# Patient Record
Sex: Female | Born: 1960 | Race: White | Hispanic: No | Smoking: Never smoker
Health system: Southern US, Community
[De-identification: ages and names within clinical notes are randomized; demographics above are authoritative.]

## PROBLEM LIST (undated history)

## (undated) DIAGNOSIS — I10 Essential (primary) hypertension: Secondary | ICD-10-CM

## (undated) DIAGNOSIS — E785 Hyperlipidemia, unspecified: Secondary | ICD-10-CM

## (undated) HISTORY — DX: Essential (primary) hypertension: I10

## (undated) HISTORY — DX: Hyperlipidemia, unspecified: E78.5

---

## 1997-10-31 ENCOUNTER — Other Ambulatory Visit: Admission: RE | Admit: 1997-10-31 | Discharge: 1997-10-31 | Payer: Self-pay | Admitting: Family Medicine

## 1999-11-27 ENCOUNTER — Encounter: Admission: RE | Admit: 1999-11-27 | Discharge: 1999-11-27 | Payer: Self-pay | Admitting: Family Medicine

## 1999-11-27 ENCOUNTER — Encounter: Payer: Self-pay | Admitting: Family Medicine

## 2008-08-15 ENCOUNTER — Other Ambulatory Visit: Admission: RE | Admit: 2008-08-15 | Discharge: 2008-08-15 | Payer: Self-pay | Admitting: Family Medicine

## 2008-12-30 ENCOUNTER — Ambulatory Visit: Payer: Self-pay | Admitting: Family Medicine

## 2009-11-14 ENCOUNTER — Other Ambulatory Visit: Admission: RE | Admit: 2009-11-14 | Discharge: 2009-11-14 | Payer: Self-pay | Admitting: Family Medicine

## 2009-11-18 ENCOUNTER — Ambulatory Visit: Payer: Self-pay

## 2010-11-19 ENCOUNTER — Ambulatory Visit: Payer: Self-pay

## 2011-02-08 ENCOUNTER — Other Ambulatory Visit: Payer: Self-pay | Admitting: Physician Assistant

## 2011-02-08 ENCOUNTER — Other Ambulatory Visit (HOSPITAL_COMMUNITY)
Admission: RE | Admit: 2011-02-08 | Discharge: 2011-02-08 | Disposition: A | Discharge status: Home / Self Care | Payer: BC Managed Care – PPO | Source: Ambulatory Visit | Attending: Family Medicine | Admitting: Family Medicine

## 2011-02-08 DIAGNOSIS — Z124 Encounter for screening for malignant neoplasm of cervix: Secondary | ICD-10-CM | POA: Insufficient documentation

## 2011-10-08 ENCOUNTER — Other Ambulatory Visit: Payer: Self-pay | Admitting: Gastroenterology

## 2011-10-27 ENCOUNTER — Other Ambulatory Visit: Payer: Self-pay | Admitting: Physician Assistant

## 2011-10-27 DIAGNOSIS — R1013 Epigastric pain: Secondary | ICD-10-CM

## 2011-11-02 ENCOUNTER — Ambulatory Visit
Admission: RE | Admit: 2011-11-02 | Discharge: 2011-11-02 | Disposition: A | Discharge status: Home / Self Care | Payer: BC Managed Care – PPO | Source: Ambulatory Visit | Attending: Physician Assistant | Admitting: Physician Assistant

## 2011-11-02 DIAGNOSIS — R1013 Epigastric pain: Secondary | ICD-10-CM

## 2011-11-24 ENCOUNTER — Ambulatory Visit: Payer: Self-pay

## 2012-04-20 ENCOUNTER — Other Ambulatory Visit (HOSPITAL_COMMUNITY)
Admission: RE | Admit: 2012-04-20 | Discharge: 2012-04-20 | Disposition: A | Discharge status: Home / Self Care | Payer: BC Managed Care – PPO | Source: Ambulatory Visit | Attending: Family Medicine | Admitting: Family Medicine

## 2012-04-20 ENCOUNTER — Other Ambulatory Visit: Payer: Self-pay | Admitting: Physician Assistant

## 2012-04-20 DIAGNOSIS — Z124 Encounter for screening for malignant neoplasm of cervix: Secondary | ICD-10-CM | POA: Insufficient documentation

## 2012-05-16 ENCOUNTER — Encounter (INDEPENDENT_AMBULATORY_CARE_PROVIDER_SITE_OTHER): Payer: Self-pay | Admitting: General Surgery

## 2012-05-16 ENCOUNTER — Ambulatory Visit (INDEPENDENT_AMBULATORY_CARE_PROVIDER_SITE_OTHER): Payer: BC Managed Care – PPO | Admitting: General Surgery

## 2012-05-16 VITALS — BP 124/80 | HR 110 | Temp 98.0°F | Resp 20 | Ht 63.5 in | Wt 186.0 lb

## 2012-05-16 DIAGNOSIS — K8020 Calculus of gallbladder without cholecystitis without obstruction: Secondary | ICD-10-CM

## 2012-05-16 NOTE — Patient Instructions (Signed)
Your recurring episodes of right upper quadrant pain and back pain are almost certainly due to your gallstones.  You will be scheduled for a laparoscopic cholecystectomy with cholangiogram, possible open in the near future.         Laparoscopic Cholecystectomy Laparoscopic cholecystectomy is surgery to remove the gallbladder. The gallbladder is located slightly to the right of center in the abdomen, behind the liver. It is a concentrating and storage sac for the bile produced in the liver. Bile aids in the digestion and absorption of fats. Gallbladder disease (cholecystitis) is an inflammation of your gallbladder. This condition is usually caused by a buildup of gallstones (cholelithiasis) in your gallbladder. Gallstones can block the flow of bile, resulting in inflammation and pain. In severe cases, emergency surgery may be required. When emergency surgery is not required, you will have time to prepare for the procedure. Laparoscopic surgery is an alternative to open surgery. Laparoscopic surgery usually has a shorter recovery time. Your common bile duct may also need to be examined and explored. Your caregiver will discuss this with you if he or she feels this should be done. If stones are found in the common bile duct, they may be removed. LET YOUR CAREGIVER KNOW ABOUT:  Allergies to food or medicine.  Medicines taken, including vitamins, herbs, eyedrops, over-the-counter medicines, and creams.  Use of steroids (by mouth or creams).  Previous problems with anesthetics or numbing medicines.  History of bleeding problems or blood clots.  Previous surgery.  Other health problems, including diabetes and kidney problems.  Possibility of pregnancy, if this applies. RISKS AND COMPLICATIONS All surgery is associated with risks. Some problems that may occur following this procedure include:  Infection.  Damage to the common bile duct, nerves, arteries, veins, or other internal organs  such as the stomach or intestines.  Bleeding.  A stone may remain in the common bile duct. BEFORE THE PROCEDURE  Do not take aspirin for 3 days prior to surgery or blood thinners for 1 week prior to surgery.  Do not eat or drink anything after midnight the night before surgery.  Let your caregiver know if you develop a cold or other infectious problem prior to surgery.  You should be present 60 minutes before the procedure or as directed. PROCEDURE  You will be given medicine that makes you sleep (general anesthetic). When you are asleep, your surgeon will make several small cuts (incisions) in your abdomen. One of these incisions is used to insert a small, lighted scope (laparoscope) into the abdomen. The laparoscope helps the surgeon see into your abdomen. Carbon dioxide gas will be pumped into your abdomen. The gas allows more room for the surgeon to perform your surgery. Other operating instruments are inserted through the other incisions. Laparoscopic procedures may not be appropriate when:  There is major scarring from previous surgery.  The gallbladder is extremely inflamed.  There are bleeding disorders or unexpected cirrhosis of the liver.  A pregnancy is near term.  Other conditions make the laparoscopic procedure impossible. If your surgeon feels it is not safe to continue with a laparoscopic procedure, he or she will perform an open abdominal procedure. In this case, the surgeon will make an incision to open the abdomen. This gives the surgeon a larger view and field to work within. This may allow the surgeon to perform procedures that sometimes cannot be performed with a laparoscope alone. Open surgery has a longer recovery time. AFTER THE PROCEDURE  You will be taken  to the recovery area where a nurse will watch and check your progress.  You may be allowed to go home the same day.  Do not resume physical activities until directed by your caregiver.  You may resume a  normal diet and activities as directed. Document Released: 01/25/2005 Document Revised: 04/19/2011 Document Reviewed: 07/10/2010 [facility] Patient Information 2013 [place], [place].

## 2012-05-16 NOTE — Progress Notes (Signed)
Patient ID: Lorraine Dunn, female   DOB: Dec 17, 1960, 52 y.o.   MRN: [id number]  Chief Complaint  Patient presents with  . New Evaluation    eval GB    HPI Lorraine Dunn is a 52 y.o. female.  She is referred by Milus Height, P.A. At [place] at [facility] for evaluation of symptomatic gallstones.  The patient gives a long history of intermittent episodes of right upper quadrant pain radiating to the back. Typically these are in the evening and will last for hours. She has had a couple of episodes of nausea and vomiting but usually she does not get nauseated. No fever or chills. No history of jaundice or pancreatitis. She had an ultrasound on 11/02/2011 which showed a 1.7 cm gallstone, normal common bile duct, no inflammation. Symptoms have been progressing. The pain is worse and  lasts longer. She is ready to have definitive surgical therapy.  No history of liver disease cardiac disease pulmonary disease or urologic problems.  Comorbidities include hypertension and hyperlipidemia. She is taking Prilosec but is taking it apparently thinking it might help her gallbladder pain. She had colonoscopy last year which was normal.  Social history reveals that she is single. Has 2 children. A son lives in [place]. Denies tobacco. Alcohol rare. 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  HPI  Past Medical History  Diagnosis Date  . Hypertension   . Hyperlipidemia     Past Surgical History  Procedure Laterality Date  . Cesarean section  01/04/1987    History reviewed. No pertinent family history.  Social History History  Substance Use Topics  . Smoking status: Never Smoker   . Smokeless tobacco: Never Used  . Alcohol Use: No    Allergies  Allergen Reactions  . Dilaudid (Hydromorphone Hcl) Shortness Of Breath    Current Outpatient Prescriptions  Medication Sig Dispense Refill  . atorvastatin (LIPITOR) 20 MG tablet Take 20 mg by mouth daily.      Marland Kitchen lisinopril-hydrochlorothiazide  (PRINZIDE,ZESTORETIC) 20-25 MG per tablet Take 1 tablet by mouth daily.      Marland Kitchen loratadine (CLARITIN) 10 MG tablet Take 10 mg by mouth daily.      Marland Kitchen omeprazole (PRILOSEC) 20 MG capsule Take 20 mg by mouth daily.       No current facility-administered medications for this visit.    Review of Systems Review of Systems  Constitutional: Negative for fever, chills and unexpected weight change.  HENT: Negative for hearing loss, congestion, sore throat, trouble swallowing and voice change.   Eyes: Negative for visual disturbance.  Respiratory: Negative for cough and wheezing.   Cardiovascular: Negative for chest pain, palpitations and leg swelling.  Gastrointestinal: Positive for nausea, vomiting and abdominal pain. Negative for diarrhea, constipation, blood in stool, abdominal distention and anal bleeding.  Genitourinary: Negative for hematuria, vaginal bleeding and difficulty urinating.  Musculoskeletal: Negative for arthralgias.  Skin: Negative for rash and wound.  Neurological: Negative for seizures, syncope and headaches.  Hematological: Negative for adenopathy. Does not bruise/bleed easily.  Psychiatric/Behavioral: Negative for confusion.    Blood pressure 124/80, pulse 110, temperature 98 F (36.7 C), temperature source Temporal, resp. rate 20, height 5' 3.5" (1.613 m), weight 186 lb (84.369 kg).  Physical Exam Physical Exam  Constitutional: She is oriented to person, place, and time. She appears well-developed and well-nourished. No distress.  BMI 32.4  HENT:  Head: Normocephalic and atraumatic.  Nose: Nose normal.  Mouth/Throat: No oropharyngeal exudate.  Eyes: Conjunctivae and EOM are  normal. Pupils are equal, round, and reactive to light. Left eye exhibits no discharge. No scleral icterus.  Neck: Neck supple. No JVD present. No tracheal deviation present. No thyromegaly present.  Cardiovascular: Normal rate, regular rhythm, normal heart sounds and intact distal pulses.   No  murmur heard. Pulmonary/Chest: Effort normal and breath sounds normal. No respiratory distress. She has no wheezes. She has no rales. She exhibits no tenderness.  Abdominal: Soft. Bowel sounds are normal. She exhibits no distension and no mass. There is no tenderness. There is no rebound and no guarding.  Musculoskeletal: She exhibits no edema and no tenderness.  Lymphadenopathy:    She has no cervical adenopathy.  Neurological: She is alert and oriented to person, place, and time. She exhibits normal muscle tone. Coordination normal.  Skin: Skin is warm. No rash noted. She is not diaphoretic. No erythema. No pallor.  Psychiatric: She has a normal mood and affect. Her behavior is normal. Judgment and thought content normal.    Data Reviewed Ultrasound report. Office notes from [facility] at  [place].  Assessment    Chronic cholecystitis with cholelithiasis, now with accelerating biliary colic  Hypertension  Borderline obesity  Hyperlipidemia  History of cesarean section     Plan    Scheduled for laparoscopic cholecystectomy with cholangiogram, possible open.  We had a long conversation about this. We have reviewed patient information booklet and diagrams. I discussed the indications, details, techniques, and numerous risks of the surgery with her. She understands all these issues and all of her questions are answered. She agrees with this plan.        Angelia Mould. Derrell Lolling, M.D., [facility] General and Minimally invasive Surgery Breast and Colorectal Surgery Office:   [phone] Pager:   [phone]  05/16/2012, 2:59 PM

## 2012-11-16 ENCOUNTER — Ambulatory Visit: Payer: Self-pay | Admitting: Family Medicine

## 2013-04-26 ENCOUNTER — Other Ambulatory Visit (HOSPITAL_COMMUNITY)
Admission: RE | Admit: 2013-04-26 | Discharge: 2013-04-26 | Disposition: A | Discharge status: Home / Self Care | Payer: BC Managed Care – PPO | Source: Ambulatory Visit | Attending: Family Medicine | Admitting: Family Medicine

## 2013-04-26 ENCOUNTER — Other Ambulatory Visit: Payer: Self-pay | Admitting: Physician Assistant

## 2013-04-26 DIAGNOSIS — Z124 Encounter for screening for malignant neoplasm of cervix: Secondary | ICD-10-CM | POA: Insufficient documentation

## 2013-11-27 ENCOUNTER — Ambulatory Visit: Payer: Self-pay | Admitting: Family Medicine

## 2014-04-11 ENCOUNTER — Inpatient Hospital Stay: Payer: Self-pay | Admitting: Internal Medicine

## 2014-06-03 LAB — SURGICAL PATHOLOGY

## 2014-06-09 NOTE — Discharge Summary (Signed)
PATIENT NAME:  Lorraine Dunn, Lorraine Dunn MR#:  [id number] DATE OF BIRTH:  [birth year]  DATE OF ADMISSION:  04/11/2014 DATE OF DISCHARGE:  04/17/2014  PRESENTING COMPLAINT: Abdominal pain, nausea.   DISCHARGE DIAGNOSES:  1. Gallstone pancreatitis.  2. Hypertension.  3. Hypophosphatemia.  4. Hypokalemia.   PROCEDURES: Laparoscopic cholecystectomy on 04/14/2014.   CODE STATUS: Full code.   MEDICATIONS:  1. Atorvastatin 20 mg daily.  2. Lisinopril 20 mg daily.  3. Multivitamin daily.  4. Vitamin B12 daily. 5. Fish oil 1 capsule daily.  6. Vitamin D3 1 capsule daily.  7. Tylenol 500 mg 2 tablets every 6 hours as needed.  8. Zofran 4 mg ODT 1 tablet 3 times a day as needed.  9. Potassium phosphate and sodium phosphate packet for 1 more day.   DIET: Mechanical soft diet.   FOLLOWUP: With [facility] in 1 to 2 weeks. Follow up with your primary care physician in 1 to 2 weeks.   SURGICAL CONSULTATION: Dr. Excell Seltzerooper and Dr. Juliann PulseLundquist.   BRIEF SUMMARY OF HOSPITAL COURSE: Ms. Otis PeakDonna Aure is a 54 year old obese Caucasian female, who comes in with:  1. Abdominal pain, which is likely due to pancreatitis from gallstone-induced. Ultrasound showed multiple stones present, duct not dilated. The patient was kept n.p.o. Her hydrochlorothiazide was held. P.r.n. pain medications were given. IV fluids were given. A surgical consultation was obtained. The patient underwent a cholecystectomy and drain was removed on the day of discharge. She was able to tolerate full liquid diet at discharge.  2. Fatty liver on ultrasound.  3. Hypertension. Continue lisinopril.  4. Hyperlipidemia. Continue statins.  5. Hypophosphatemia, hypokalemia. Repleted with IV and p.o.   Hospital stay otherwise remained stable.   CODE STATUS: The patient remained a full code.  ____________________________ Wylie HailSona A. Allena KatzPatel, MD sap:ap D: 04/19/2014 14:07:21 ET T: 04/19/2014 21:16:32 ET JOB#: [id number]  cc: Micala Saltsman A. Allena KatzPatel, MD,  <Dictator> unknown cc Willow OraSONA A Evola Hollis MD ELECTRONICALLY SIGNED 04/20/2014 14:30

## 2014-06-09 NOTE — H&P (Signed)
PATIENT NAME:  Lorraine BarefootCOMER, Lorraine J MR#:  [id number] DATE OF BIRTH:  Jul 05, 1960  DATE OF ADMISSION:  04/11/2014  TIME OF ADMISSION: 9:43 p.m.  CHIEF COMPLAINT: Abdominal pain.   HISTORY OF PRESENT ILLNESS: This is a 54 year old female who came to the ED for significant abdominal pain that started about 10:30 this morning, which was about 30 minutes after she took some of her normal vitamins on an empty stomach. She states that afterward she began having upper abdominal pain across both upper quadrants, but the right upper quadrant was more so than the left, and that the pain continued to progress, was associated with nausea and vomiting a total of 6 times today without hematemesis, that the pain did have some radiation in through to her back. The patient denies any diarrhea associated with these symptoms. Denies any other issues on review of systems; see full review of systems below. The patient has known gallstones from an ultrasound that was done several years ago when she had a similar pain to this, but she never had the elective surgery to have the gallbladder removed, and as far as she knows she never passed a stone and has never had pancreatitis before. On workup in the ED, the patient was found to have elevated transaminases and a significantly elevated lipase and had a right upper quadrant ultrasound ordered. At this point, the hospitalists were called for admission for pancreatitis.  PRIMARY CARE PHYSICIAN: Nonlocal, Dr. Blair Heysobert Ehinger, as well as that doctor's PA, Noelle Redmon. These providers are at [facility] in [place]  PAST MEDICAL HISTORY: Hypertension, hyperlipidemia, and gallstones.  CURRENT MEDICATIONS: Vitamin D3, vitamin B12, lisinopril and hydrochlorothiazide combination 20/25 mg daily, atorvastatin 20 mg daily, fish oil, multivitamin, and Tylenol as needed for pain.  PAST SURGICAL HISTORY: Cesarean section and tonsillectomy.  ALLERGIES:  DILAUDID.  FAMILY HISTORY: Coronary artery disease, diabetes mellitus.   SOCIAL HISTORY: The patient is a never smoker, an occasional social drinker, and denies any other drug use.  REVIEW OF SYSTEMS: CONSTITUTIONAL: Denies fever, fatigue, and weakness. EYES: Denies blurred or double vision, pain or redness. EARS, NOSE, AND THROAT: Denies ear pain, hearing loss, or difficulty swallowing.  RESPIRATORY: Denies cough, wheeze, hemoptysis, or dyspnea. CARDIOVASCULAR: Denies chest pain, dyspnea on exertion, palpitations. GASTROINTESTINAL: Endorses abdominal pain. Endorses nausea and vomiting. Denies diarrhea. Denies hematemesis and melena. Denies constipation or change in bowel habits. GENITOURINARY: Denies dysuria, hematuria, or frequency.  ENDOCRINE: Denies nocturia, thyroid problems, heat or cold intolerance. HEMATOLOGIC AND LYMPHATIC: Denies easy bruising, bleeding, or swollen glands. INTEGUMENTARY: Denies acne, rash, or lesions.  MUSCULOSKELETAL: Denies joint pains, joint swelling, and gout. NEUROLOGICAL: Denies numbness, weakness, headaches.  PSYCHIATRIC: Denies anxiety, insomnia, and depression.   PHYSICAL EXAMINATION: VITAL SIGNS: Blood pressure 150/66, pulse 53, temperature 97.4, respirations 18, with 97% oxygen saturations on room air. GENERAL: This patient is a well-nourished female lying supine in bed in no acute distress. HEAD, EYES, EARS, NOSE, AND THROAT: Pupils equal, round, reactive to light. Extraocular movements intact. No scleral icterus. Moist mucosal membranes. Good dentition. NECK: Thyroid is not enlarged. Neck is supple with no masses and is nontender. No cervical adenopathy, no JVD noted. RESPIRATORY: Clear to auscultation bilaterally with no rales, rhonchi, or wheezes. Good breath sounds equally throughout all lung fields. CARDIOVASCULAR: Regular rate and rhythm. No murmur, rubs, or gallops auscultated. Good pedal pulses. No lower extremity edema. ABDOMEN: Soft,  nondistended, with good bowel sounds. Tender in the epigastric region and right upper quadrant  with, also, some mild tenderness in the left upper quadrant. MUSCULOSKELETAL: 5/5 muscular strength throughout all 4 extremities with full range of motion spontaneously throughout all extremities. No distal cyanosis or clubbing. SKIN: Warm, dry, and intact with no rashes or lesions noted. LYMPHADENOPATHY: No adenopathy noted.  NEUROLOGIC: Cranial nerves intact. Sensation intact throughout with no dysarthria or aphasia.  PSYCHIATRIC: Patient is alert and oriented x 4, cooperative, with good insight and judgment.  LABORATORIES: White count 12.8, hemoglobin 14, hematocrit 43, platelets 276,000. Sodium 139, potassium 3.4, chloride 100, bicarbonate 30, BUN 16, creatinine 1.13. Total protein 7.6, albumin 4.0, alkaline phosphatase 102, AST elevated at 114, ALT elevated at 191, lipase elevated at 5111. Glucose 209. Troponin less than 0.02. UA was negative.  RADIOLOGY: Ultrasound of the right upper quadrant showed common bile duct 3.4 mm, which is within normal limits. Probable fatty infiltration of the liver. Cholelithiasis noted, without evidence of cholecystitis.  ASSESSMENT AND PLAN:  1.  Pancreatitis, unclear etiology at this time. The patient does have a known history of gallstones, though according to her ultrasound it does not look as though she has passed a gallstone, given the normal common bile duct size. Still, she has some elevated transaminases. We will hold all Tylenol and her statin medication for now, keep her n.p.o., give her fluids and pain control, and, depending on how she does, possibly consider a gastroenterology consult in the morning.  2.  Cholelithiasis. The patient has a known history of cholelithiasis. Right upper quadrant ultrasound showed no evidence of cholecystitis. Again, consider a gastroenterology or maybe a surgical consult, depending on how she progresses with her treatment for  pancreatitis. 3.  Hypertension. This is stable. Right now, we will continue patient's home medications for this. 4.  Elevated glucose. The patient has no history of a diagnosis of diabetes mellitus, although her glucose is pretty elevated. She is in acute state of stress in her body at the same time. We will get an A1c just for diabetes screening.  CODE STATUS: This patient is full code.  PROPHYLAXIS: Deep vein thrombosis prophylaxis is just with mechanical SCDs.  TIME SPENT ON THIS ADMISSION: 45 minutes.   ____________________________ Candace Cruise. Anne Hahn, MD dfw:ST D: 04/11/2014 22:44:33 ET T: 04/11/2014 23:07:26 ET JOB#: [id number]  cc: Candace Cruise. Anne Hahn, MD, <Dictator> Doyle Kunath Scotty Court MD ELECTRONICALLY SIGNED 04/12/2014 2:06

## 2014-06-09 NOTE — Consult Note (Signed)
Chief Complaint:  Subjective/Chief Complaint patient seen for pancreatitis.  Please see full Gi consult. Pati3ent admitted with nv and abdominalpain.  Finding on imaging of cholelithiasis, elevated lipase, mild elevations of transaminases, all improving.  Likely biliary pancreatitis.  Currently doesnt feel bloated and pain is improving.   Will increase ivf some to 150 cc/hour, serial labs (daily), Surgery consult already requested and done By Dr Burt Knack.  Agree with [facility] when clinically feasible, once lipase normalized.   Following.   VITAL SIGNS/ANCILLARY NOTES: **Vital Signs.:   04-Mar-16 16:14  Vital Signs Type Q 8hr  Temperature Temperature (F) 99.2  Temperature Source oral  Pulse Pulse 94  Respirations Respirations 18  Systolic BP Systolic BP 063  Diastolic BP (mmHg) Diastolic BP (mmHg) 69  Mean BP 83  Pulse Ox % Pulse Ox % 83  Pulse Ox Activity Level  At rest; Nurse  Notified  Oxygen Delivery Room Air/ 21 %   Brief Assessment:  Cardiac Regular   Respiratory clear BS   Gastrointestinal details normal Soft  No rebound tenderness  positive bowel sounds, mild distension, mild generalized tenderness, though mostly epigastric and ruq.   Lab Results: Hepatic:  04-Mar-16 15:18   Bilirubin, Total 0.7  Alkaline Phosphatase 79  SGPT (ALT)  125  SGOT (AST)  52  Total Protein, Serum 7.0  Albumin, Serum 3.5  Routine Chem:  03-Mar-16 17:32   Lipase  5111 (Result(s) reported on 11 Apr 2014 at 06:07PM.)  04-Mar-16 15:18   Cholesterol, Serum 150  Triglycerides, Serum 74  HDL (INHOUSE)  77  LDL Cholesterol Calculated 58 (Result(s) reported on 12 Apr 2014 at 05:57PM.)  Glucose, Serum  146  BUN 16  Creatinine (comp) 0.81  Sodium, Serum 142  Potassium, Serum 3.6  Chloride, Serum 105  CO2, Serum  33  Calcium (Total), Serum  8.3  Osmolality (calc) 287  eGFR (African American) >60  eGFR (Non-African American) >60 (eGFR values <17mL/min/1.73 m2 may be an indication of chronic kidney  disease (CKD). Calculated eGFR, using the MRDR Study equation, is useful in  patients with stable renal function. The eGFR calculation will not be reliable in acutely ill patients when serum creatinine is changing rapidly. It is not useful in patients on dialysis. The eGFR calculation may not be applicable to patients at the low and high extremes of body sizes, pregnant women, and vegetarians.)  Anion Gap  4  Lipase  2460 (Result(s) reported on 12 Apr 2014 at 04:13PM.)  Routine Hem:  03-Mar-16 17:32   WBC (CBC)  12.8   Assessment/Plan:  Assessment/Plan:  Assessment 1) biliary pancreatitis-improving.  agre with ccy when clinically feasible, following, continue current.   Plan as above.   Electronic Signatures: Loistine Simas (MD)  (Signed 04-Mar-16 20:21)  Authored: Chief Complaint, VITAL SIGNS/ANCILLARY NOTES, Brief Assessment, Lab Results, Assessment/Plan   Last Updated: 04-Mar-16 20:21 by Loistine Simas (MD)

## 2014-06-09 NOTE — Op Note (Signed)
PATIENT NAME:  Lorraine BarefootCOMER, Afiya J MR#:  [id number] DATE OF BIRTH:  08-10-1960  DATE OF PROCEDURE:  04/14/2014  PREOPERATIVE DIAGNOSIS: Biliary pancreatitis.   POSTOPERATIVE DIAGNOSIS: Biliary pancreatitis.  PROCEDURE: Laparoscopic cholecystectomy with C-arm fluoroscopic cholangiography.   SURGEON: Tykia Mellone E. Excell Seltzerooper, MD.  ANESTHESIA: General with endotracheal tube.   INDICATIONS: This is a patient with documented biliary pancreatitis. Her liver function tests and lipase have returned to normal. Therefore, she is scheduled for semi-elective laparoscopic cholecystectomy with cholangiography to prevent return of her symptoms.  Preoperatively, we discussed the rationale for surgery, the options of observation, the risks of bleeding, infection, recurrence of symptoms, failure to resolve her symptoms, open procedure, bile duct damage, bile duct leak, retained common bile duct stone, any of which could require further surgery and/or ERCP, stent, and papillotomy. This was all reviewed for her on the morning of surgery as well. She understood and agreed to proceed.   FINDINGS: Cloudy fluid in the area of the gallbladder, edematous gallbladder wall, tiny cystic duct. C-arm fluoroscopic cholangiography demonstrated good flow in the duodenum. No intraluminal filling defects. Proximal ducts were well identified and the cystic duct had been cannulated.   DESCRIPTION OF PROCEDURE: The patient was induced to general anesthesia. She was given IV antibiotics. VTE prophylaxis was in place. She was prepped and draped in a sterile fashion. Marcaine was infiltrated in skin and subcutaneous tissues around the periumbilical area. Incision was made. Veress needle was placed. Pneumoperitoneum was obtained. A 5-mm trocar port was placed. The abdominal cavity was explored and, under direct vision, a 10-mm epigastric port and 2 lateral 5-mm ports were placed. Cloudy fluid was aspirated. Scope was changed to a 30-degree 5-mm to  allow visualization of the infundibulum. Here, the peritoneum over the infundibulum was incised bluntly. The cystic duct/gallbladder junction was well identified. The cystic lymphatics were doubly clipped and divided. The tiny cystic duct was identified. It was not much bigger than the cholangiogram catheter but it was clipped and incised, and the cholangiogram catheter was then placed. C-arm fluoroscopic cholangiography demonstrated the above. The cholangiogram catheter was removed. The cystic duct was then doubly clipped and divided and the gallbladder was taken from the gallbladder fossa with electrocautery, passed out through the epigastric port site. There were vessels along the lateral and medial side of the gallbladder, which required clipping and the cystic artery had been doubly clipped and divided as well.  The area was irrigated with copious amounts of normal saline. Hemostasis was with the use of clips or electrocautery. Then into the foramen of Winslow was placed a 10-mm JP drain which was held in with 3-0 nylon. Again, hemostasis was found to be adequate. The camera was placed in the epigastric site to view back to the periumbilical site. There was no sign of adhesions or bowel injury. Therefore, pneumoperitoneum was released. All ports were removed. Fascial edges at the epigastric site were approximated with figure-of-eight 0 Vicryls; 4-0 subcuticular Monocryl was used on all skin edges. Steri-Strips, Mastisol, and sterile dressings were placed. The drain was placed to bulb suction.  The patient tolerated the procedure well. There were no complications. She was taken to the recovery room in stable condition to be admitted for continued care.    ____________________________ Adah Salvageichard E. Excell Seltzerooper, MD rec:jh D: 04/14/2014 09:15:45 ET T: 04/14/2014 12:58:03 ET JOB#: [id number]  cc: Adah Salvageichard E. Excell Seltzerooper, MD, <Dictator> Lattie HawICHARD E Debbi Strandberg MD ELECTRONICALLY SIGNED 04/14/2014 16:57

## 2014-06-09 NOTE — Consult Note (Signed)
PATIENT NAME:  [name], Dania J MR#:  [id number] DATE OF BIRTH:  1961-01-12  DATE OF CONSULTATION:  04/12/2014  REFERRING PHYSICIAN:   CONSULTING PHYSICIAN:  Adah Salvageichard E. Excell Seltzerooper, MD  CHIEF COMPLAINT: Abdominal pain.   HISTORY OF PRESENT ILLNESS: This is a patient who has biliary pancreatitis. She believes it started after taking multiple vitamins on an empty stomach yesterday morning.  She has never had an episode like this before, but states that she has had "gallbladder attacks" in the past. She has seen a surgeon in the past for gallbladder disease but never had her gallbladder removed. She has known about her gallstones for many years.  She denies fevers or chills,  no melena or hematochezia, and no jaundice or acholic stools. Her pain is in the epigastrium and radiates through to her back. It is much improved since admission.   PAST MEDICAL HISTORY: Hypertension and hyperlipidemia.   PAST SURGICAL HISTORY: C-section.   ALLERGIES: DILAUDID.   MEDICATIONS: Multiple, see reconciliation.   FAMILY HISTORY: History of gallbladder disease.  REVIEW OF SYSTEMS: A complete system review was performed and negative with the exception of that mentioned in the HPI.   SOCIAL HISTORY: The patient works as a Merchandiser, retailsupervisor at [employer]. Does not smoke or drink.   PHYSICAL EXAMINATION: GENERAL: Healthy-appearing female patient. BMI 32.  VITAL SIGNS: Stable. She is afebrile.  HEENT: No scleral icterus.  NECK: No palpable neck nodes.  CHEST: Clear to auscultation.  CARDIAC: Regular rate and rhythm.  ABDOMEN: Soft, nondistended, minimally tender in the epigastrium and right upper quadrant.  EXTREMITIES: Show minimal edema.  NEUROLOGIC: Grossly intact.  INTEGUMENT: No jaundice.   LABORATORY VALUES: Reviewed. Lipase is much less than it was prior. LFTs remain elevated. An ultrasound shows gallstones without cholecystitis.   ASSESSMENT AND PLAN: This is a patient with biliary pancreatitis. I spoke  to Dr. Marva PandaSkulskie concerning this patient's care. My recommendations would be continue observation. She can have clear liquids at this point and once she has her lipase returned to normal, then we could proceed with laparoscopic cholecystectomy with cholangiography. This was discussed with her. She understood and agreed to proceed.   ____________________________ Adah Salvageichard E. Excell Seltzerooper, MD rec:dw D: 04/12/2014 17:45:27 ET T: 04/12/2014 20:20:26 ET JOB#: [id number]  cc: Adah Salvageichard E. Excell Seltzerooper, MD, <Dictator> Lattie HawICHARD E Jamille Fisher MD ELECTRONICALLY SIGNED 04/13/2014 10:41

## 2014-06-09 NOTE — Consult Note (Signed)
Brief Consult Note: Diagnosis: biliary pancreatitis.   Patient was seen by consultant.   Consult note dictated.   Recommend to proceed with surgery or procedure.   Orders entered.   Discussed with Attending MD.   Comments: plan lap chole with grams once lipase returns to nml. Will follow.  Electronic Signatures: Lattie Hawooper, Willies Laviolette E (MD)  (Signed 04-Mar-16 17:40)  Authored: Brief Consult Note   Last Updated: 04-Mar-16 17:40 by Lattie Hawooper, Chaston Bradburn E (MD)

## 2014-06-09 NOTE — Discharge Summary (Signed)
PATIENT NAME:  [name], Lorraine Dunn DATE OF BIRTH:  10/23/1960  DATE OF ADMISSION:  04/11/2014 DATE OF DISCHARGE:  04/17/2014  DIAGNOSES:  Biliary pancreatitis, choledocholithiasis, hypertension, hyperlipidemia, and gallstones.   PROCEDURES:  Laparoscopic cholecystectomy with fluoroscopic cholangiography.   HISTORY OF PRESENT ILLNESS AND HOSPITAL COURSE:  This is a patient who was admitted to the hospital with a diagnosis of biliary pancreatitis. She was taken to the operating room, where laparoscopic cholecystectomy was performed. Cholangiography was performed as well. She made an uncomplicated postoperative recovery, although it was slightly prolonged due to ileus. The drain was removed. She was discharged in stable condition on a regular diet, to follow up in our office on oral analgesics, and she will follow up in 10 days.    ____________________________ Adah Salvageichard E. Excell Seltzerooper, MD rec:nb D: 04/22/2014 21:08:21 ET T: 04/22/2014 23:40:46 ET JOB#: [id number]  cc: Adah Salvageichard E. Excell Seltzerooper, MD, <Dictator> Lattie HawICHARD E Nikoletta Varma MD ELECTRONICALLY SIGNED 04/23/2014 22:52

## 2014-06-09 NOTE — Consult Note (Signed)
PATIENT NAME:  Lorraine Dunn, Lorraine Dunn MR#:  [id number] DATE OF BIRTH:  04-30-1960  DATE OF CONSULTATION:  04/12/2014  REFERRING PHYSICIAN:  Santina Evans P. Clent Ridges, MD  CONSULTING PHYSICIAN:  Christena Deem, MD/Millianna Szymborski A. Arvilla Market, ANP (Adult Nurse Practitioner)  REASON FOR CONSULTATION: Pancreatitis.   HISTORY OF PRESENT ILLNESS: This 54 year old Caucasian female patient with history of hypertension, hyperlipidemia, and known gallstones, presented to the Emergency Room with acute episode of abdominal pain, back pain, and vomiting. The patient states that she started to have gallbladder issues about 2 years ago. She has had intermittent attacks of mild right upper quadrant pain that radiates into the back. This can go along for a 24-hour period and then ease off. She usually does not have nausea or vomiting and finds these attacks of pain mild. They occur every few months. She has discussed this with her physician and since they not severe, a cholecystectomy has not been seriously considered.   However, yesterday, she developed an acute episode of the worst pain she has ever had, similar to her gallbladder attacks. This pain, however, started in the mid abdomen, was generalized throughout the entire abdomen, and radiated into the lower back. This pain was clearly a 10/10. She had associated vomiting to the point of dry heaves. She denies coffee-ground emesis, hematemesis, or diarrhea or melena. She thought this was a severe attack of her gallbladder pain and presented to the Emergency Room.   She was found to have elevated transaminases, and a right upper quadrant abdominal ultrasound was performed and it showed multiple gallstones with the largest measuring 1.9 cm in the fundus. There was no gallbladder wall thickening or pericolic cystic fluid or Murphy sign present. The common bile duct was normal at 3.4 mm. The liver showed mild increased echogenicity, suggesting fatty infiltration. The lipase was elevated at  5111. She was admitted with the diagnosis of pancreatitis, cholelithiasis, and has received IV fluids, narcotic pain medication, anti-emetic medication and her stomach is feeling some better. Her abdominal pain is 6-7/10; however, she still has lumbar back pain 10/10.   The patient denies history of heartburn or indigestion. She denies NSAID use because of hypertension, she only takes Tylenol. She does not present on a proton pump inhibitor. She reports no history of ulcer or previous EGD. She has had colonoscopy about 3 years ago in [place] and reports it was normal. She has noted no bowel problems, no bowel habit change.   PAST MEDICAL HISTORY: 1. Hypertension.  2. Hyperlipidemia.  3. Gallstones.   PAST SURGICAL HISTORY: 1. C-section.  2. Tonsillectomy.    HOME MEDICATIONS: 1. Vitamin D3.  2. Vitamin B12.  3. Lisinopril.  4. Hydrochlorothiazide 20/25 mg daily.  5. Atorvastatin 20 mg daily.  6. Fish oil.  7. Multivitamin.  8. Tylenol as needed for pain.   ALLERGIES: Dilaudid.   FAMILY HISTORY: Negative for colon cancer, colon polyps. Positive for CAD and disease diabetes mellitus.   SOCIAL HABITS: The patient has never smoked, occasional rare social alcohol use, no illicit drug use.   REVIEW OF SYSTEMS: Twelve systems reviewed, positive as noted in the history of present illness. She denies fevers or chills, chest pain, shortness of breath, and the remaining systems are negative.   PHYSICAL EXAMINATION: VITAL SIGNS: 98.7, 73, 18, 111/70, pulse oximetry on room air is 90%.  GENERAL: The patient is well-appearing, resting in bed, NAD.  HEENT: Head is normocephalic. Conjunctivae pink. Sclerae anicteric. Oral mucosa is moist and intact.  NECK:  Supple. Trachea is midline.  CARDIAC: S1, S2 without murmur or gallop.  LUNGS: Fully CTA. Respirations are nonlabored.    ABDOMEN: Soft, nondistended. Positive diffuse tenderness in then generalized abdomen. This is mild.  RECTAL:  Deferred.  SKIN: Warm and dry without rash or edema.  MUSCULOSKELETAL: Strength is equal bilateral 5/5 SKIN: Warm and dry. No adenopathy noted. No peripheral edema noted.  NEUROLOGIC: Cranial nerves II through XII grossly intact. Speech is clear, good historian.  PSYCHIATRIC: Affect and mood within normal, visiting with her son, NAD.   LABORATORY DATA: Admission blood work with glucose 209, hemoglobin A1c 7.1. The patient denies history of diabetes. BUN is 16, creatinine 1.13, albumin 4.0, AST 114, ALT 191. Troponin less than 0.02. WBC 12.8. Hemoglobin is 14.0.   Repeat laboratory studies this morning with glucose 146, BUN 16, creatinine 0.81, lipase down 2460, AST 52, ALT 125.   RADIOLOGY: Ultrasound as noted with cholelithiasis, fatty liver.   IMPRESSION: The patient admitted with known gallstones, elevated liver enzymes, and pancreatitis. Etiology for her pancreatitis is likely gallstone-related. We have added on triglyceride, which returned 74.   PLAN:  1. Recommend surgical consult, not urgent. Lipase will need to improve before surgery.  2. She appears to have diabetes, and this may be undiagnosed at this point.  3. Dr. Marva PandaSkulskie was consulted regarding this case. He does recommend increasing her IV fluids to 150 mL per hour until tomorrow. She will need to have close monitoring regarding pulmonary status. Consider chest x-ray.  4. Further GI recommendations pending clinical course.   Thank you for the consultation.   These services provided by Amedeo KinsmanKimberly Ricke Kimoto under collaborative agreement with Juventino SlovakMartin U. Skulski, MD.    ____________________________ Ranae PlumberKimberly A. Arvilla MarketMills, ANP (Adult Nurse Practitioner) kam:mw D: 04/12/2014 18:23:34 ET T: 04/12/2014 19:15:41 ET JOB#: [id number]  cc: Cala BradfordKimberly A. Arvilla MarketMills, ANP (Adult Nurse Practitioner), <Dictator> Ranae PlumberKimberly A. Suzette BattiestMills RN, MSN, ANP-BC Adult Nurse Practitioner ELECTRONICALLY SIGNED 04/13/2014 8:46

## 2014-06-09 NOTE — Consult Note (Signed)
Chief Complaint:  Subjective/Chief Complaint seen for pancreatitis.  felling some better today, continues with some abdominal distension though pain improved, mild nausea this am.   VITAL SIGNS/ANCILLARY NOTES: **Vital Signs.:   05-Mar-16 07:50  Vital Signs Type Q 8hr  Temperature Temperature (F) 98.6  Temperature Source oral  Pulse Pulse 85  Respirations Respirations 20  Systolic BP Systolic BP 633  Diastolic BP (mmHg) Diastolic BP (mmHg) 71  Mean BP 86  Pulse Ox % Pulse Ox % 97  Pulse Ox Activity Level  At rest  Oxygen Delivery 2L   Brief Assessment:  Cardiac Regular   Respiratory clear BS   Gastrointestinal details normal No rebound tenderness  mild distension, generalized tenderness to palpation, mostly epigastric.   Lab Results: Hepatic:  03-Mar-16 17:32   Bilirubin, Total 0.6  Alkaline Phosphatase 102  SGPT (ALT)  191  SGOT (AST)  114  04-Mar-16 15:18   Bilirubin, Total 0.7  Alkaline Phosphatase 79  SGPT (ALT)  125  SGOT (AST)  52  05-Mar-16 05:59   Bilirubin, Total 0.8  Alkaline Phosphatase 61  SGPT (ALT)  83  SGOT (AST) 34  Total Protein, Serum  6.2  Albumin, Serum  2.8  Routine Chem:  03-Mar-16 17:32   Lipase  5111 (Result(s) reported on 11 Apr 2014 at 06:07PM.)  04-Mar-16 15:18   Lipase  2460 (Result(s) reported on 12 Apr 2014 at 04:13PM.)  05-Mar-16 05:59   Glucose, Serum  124  BUN 11  Creatinine (comp) 0.84  Sodium, Serum 141  Potassium, Serum  2.9  Chloride, Serum 101  CO2, Serum 32  Calcium (Total), Serum  7.7  Osmolality (calc) 282  eGFR (African American) >60  eGFR (Non-African American) >60 (eGFR values <69m/min/1.73 m2 may be an indication of chronic kidney disease (CKD). Calculated eGFR, using the MRDR Study equation, is useful in  patients with stable renal function. The eGFR calculation will not be reliable in acutely ill patients when serum creatinine is changing rapidly. It is not useful in patients on dialysis. The eGFR  calculation may not be applicable to patients at the low and high extremes of body sizes, pregnant women, and vegetarians.)  Anion Gap 8  Lipase  914 (Result(s) reported on 13 Apr 2014 at 06:52AM.)  Routine Hem:  03-Mar-16 17:32   WBC (CBC)  12.8  05-Mar-16 05:59   WBC (CBC)  15.9  RBC (CBC) 4.18  Hemoglobin (CBC) 12.4  Hematocrit (CBC) 37.8  Platelet Count (CBC) 199  MCV 91  MCHC 32.7  RDW 13.7  Neutrophil % 87.7  Lymphocyte % 6.9  Monocyte % 5.3  Eosinophil % 0.0  Basophil % 0.1  Neutrophil #  14.0  Lymphocyte # 1.1  Monocyte # 0.8  Eosinophil # 0.0  Basophil # 0.0 (Result(s) reported on 13 Apr 2014 at 0[facility])   Radiology Results: [place]    03-Mar-16 21:31, [place]  [place]   REASON FOR EXAM:    epi and ruq pain  COMMENTS:   Body Site: Gallbladder, Liver, Common Bile Duct    PROCEDURE: [place] - [place] SURVEY  - Apr 11 2014  9:31PM     CLINICAL DATA:  Right upper quadrant abdominal pain.    EXAM:  [place] - RIGHT UPPER QUADRANT    COMPARISON:  None.    FINDINGS:  Gallbladder:  Multiple gallstones are noted with the largest measuring 1.9 cm in  the fundus. No gallbladder wall thickening  or pericholecystic fluid  is noted. No sonographic Murphy's signis noted.    Common bile duct:    Diameter: 3.4 mm which is within normal limits.    Liver:    No focal lesion identified. Mildly increased echogenicity is noted  suggesting fatty infiltration.     IMPRESSION:  Probable fatty infiltration of the liver. Cholelithiasis is noted  without evidence of cholecystitis.      Electronically Signed    By: Marijo Conception, M.D.    On: 04/11/2014 21:53         Verified By: Marveen Reeks, M.D.,   Assessment/Plan:  Assessment/Plan:  Assessment 1) biliary pancreatitis, cholelithiasis, normal cbd dimension without evidence of choledocholithiasis.  lfts and lipase improving.   Plan 1) agree with [facility] when  clinically feasible.  Continue current.   Electronic Signatures: Loistine Simas (MD)  (Signed 05-Mar-16 17:01)  Authored: Chief Complaint, VITAL SIGNS/ANCILLARY NOTES, Brief Assessment, Lab Results, Radiology Results, Assessment/Plan   Last Updated: 05-Mar-16 17:01 by Loistine Simas (MD)

## 2014-12-05 ENCOUNTER — Other Ambulatory Visit: Payer: Self-pay | Admitting: Physician Assistant

## 2014-12-05 DIAGNOSIS — Z1231 Encounter for screening mammogram for malignant neoplasm of breast: Secondary | ICD-10-CM

## 2014-12-10 ENCOUNTER — Ambulatory Visit
Admission: RE | Admit: 2014-12-10 | Discharge: 2014-12-10 | Disposition: A | Discharge status: Home / Self Care | Payer: BLUE CROSS/BLUE SHIELD | Source: Ambulatory Visit | Attending: Physician Assistant | Admitting: Physician Assistant

## 2014-12-10 DIAGNOSIS — Z1231 Encounter for screening mammogram for malignant neoplasm of breast: Secondary | ICD-10-CM | POA: Insufficient documentation

## 2015-06-13 DIAGNOSIS — E119 Type 2 diabetes mellitus without complications: Secondary | ICD-10-CM | POA: Diagnosis not present

## 2015-06-13 DIAGNOSIS — H5203 Hypermetropia, bilateral: Secondary | ICD-10-CM | POA: Diagnosis not present

## 2015-07-16 ENCOUNTER — Other Ambulatory Visit: Payer: Self-pay | Admitting: Physician Assistant

## 2015-07-16 ENCOUNTER — Other Ambulatory Visit (HOSPITAL_COMMUNITY)
Admission: RE | Admit: 2015-07-16 | Discharge: 2015-07-16 | Disposition: A | Discharge status: Home / Self Care | Payer: BLUE CROSS/BLUE SHIELD | Source: Ambulatory Visit | Attending: Family Medicine | Admitting: Family Medicine

## 2015-07-16 DIAGNOSIS — Z124 Encounter for screening for malignant neoplasm of cervix: Secondary | ICD-10-CM | POA: Insufficient documentation

## 2015-07-16 DIAGNOSIS — I10 Essential (primary) hypertension: Secondary | ICD-10-CM | POA: Diagnosis not present

## 2015-07-16 DIAGNOSIS — E119 Type 2 diabetes mellitus without complications: Secondary | ICD-10-CM | POA: Diagnosis not present

## 2015-07-16 DIAGNOSIS — E7800 Pure hypercholesterolemia, unspecified: Secondary | ICD-10-CM | POA: Diagnosis not present

## 2015-07-16 DIAGNOSIS — Z0000 Encounter for general adult medical examination without abnormal findings: Secondary | ICD-10-CM | POA: Diagnosis not present

## 2015-07-18 LAB — CYTOLOGY - PAP

## 2015-12-02 ENCOUNTER — Other Ambulatory Visit: Payer: Self-pay | Admitting: Physician Assistant

## 2015-12-02 DIAGNOSIS — Z1231 Encounter for screening mammogram for malignant neoplasm of breast: Secondary | ICD-10-CM

## 2015-12-18 ENCOUNTER — Ambulatory Visit
Admission: RE | Admit: 2015-12-18 | Discharge: 2015-12-18 | Disposition: A | Discharge status: Home / Self Care | Payer: BLUE CROSS/BLUE SHIELD | Source: Ambulatory Visit | Attending: Physician Assistant | Admitting: Physician Assistant

## 2015-12-18 DIAGNOSIS — Z1231 Encounter for screening mammogram for malignant neoplasm of breast: Secondary | ICD-10-CM | POA: Diagnosis not present

## 2016-01-15 DIAGNOSIS — E7800 Pure hypercholesterolemia, unspecified: Secondary | ICD-10-CM | POA: Diagnosis not present

## 2016-01-15 DIAGNOSIS — K5900 Constipation, unspecified: Secondary | ICD-10-CM | POA: Diagnosis not present

## 2016-01-15 DIAGNOSIS — I10 Essential (primary) hypertension: Secondary | ICD-10-CM | POA: Diagnosis not present

## 2016-01-15 DIAGNOSIS — E119 Type 2 diabetes mellitus without complications: Secondary | ICD-10-CM | POA: Diagnosis not present

## 2016-07-30 DIAGNOSIS — Z7984 Long term (current) use of oral hypoglycemic drugs: Secondary | ICD-10-CM | POA: Diagnosis not present

## 2016-07-30 DIAGNOSIS — E7800 Pure hypercholesterolemia, unspecified: Secondary | ICD-10-CM | POA: Diagnosis not present

## 2016-07-30 DIAGNOSIS — I10 Essential (primary) hypertension: Secondary | ICD-10-CM | POA: Diagnosis not present

## 2016-07-30 DIAGNOSIS — E1165 Type 2 diabetes mellitus with hyperglycemia: Secondary | ICD-10-CM | POA: Diagnosis not present

## 2016-07-30 DIAGNOSIS — Z0000 Encounter for general adult medical examination without abnormal findings: Secondary | ICD-10-CM | POA: Diagnosis not present

## 2016-12-09 ENCOUNTER — Other Ambulatory Visit: Payer: Self-pay | Admitting: Physician Assistant

## 2016-12-09 DIAGNOSIS — Z1231 Encounter for screening mammogram for malignant neoplasm of breast: Secondary | ICD-10-CM

## 2016-12-22 ENCOUNTER — Ambulatory Visit
Admission: RE | Admit: 2016-12-22 | Discharge: 2016-12-22 | Disposition: A | Discharge status: Home / Self Care | Payer: BLUE CROSS/BLUE SHIELD | Source: Ambulatory Visit | Attending: Physician Assistant | Admitting: Physician Assistant

## 2016-12-22 DIAGNOSIS — Z1231 Encounter for screening mammogram for malignant neoplasm of breast: Secondary | ICD-10-CM | POA: Diagnosis not present

## 2017-01-25 DIAGNOSIS — E119 Type 2 diabetes mellitus without complications: Secondary | ICD-10-CM | POA: Diagnosis not present

## 2017-02-24 DIAGNOSIS — E119 Type 2 diabetes mellitus without complications: Secondary | ICD-10-CM | POA: Diagnosis not present

## 2017-02-24 DIAGNOSIS — M25539 Pain in unspecified wrist: Secondary | ICD-10-CM | POA: Diagnosis not present

## 2017-02-24 DIAGNOSIS — E7800 Pure hypercholesterolemia, unspecified: Secondary | ICD-10-CM | POA: Diagnosis not present

## 2017-02-24 DIAGNOSIS — I10 Essential (primary) hypertension: Secondary | ICD-10-CM | POA: Diagnosis not present

## 2017-05-23 DIAGNOSIS — E119 Type 2 diabetes mellitus without complications: Secondary | ICD-10-CM | POA: Diagnosis not present

## 2017-08-03 DIAGNOSIS — I10 Essential (primary) hypertension: Secondary | ICD-10-CM | POA: Diagnosis not present

## 2017-08-03 DIAGNOSIS — E7800 Pure hypercholesterolemia, unspecified: Secondary | ICD-10-CM | POA: Diagnosis not present

## 2017-08-03 DIAGNOSIS — Z0000 Encounter for general adult medical examination without abnormal findings: Secondary | ICD-10-CM | POA: Diagnosis not present

## 2017-08-03 DIAGNOSIS — E1169 Type 2 diabetes mellitus with other specified complication: Secondary | ICD-10-CM | POA: Diagnosis not present

## 2017-08-03 DIAGNOSIS — Z136 Encounter for screening for cardiovascular disorders: Secondary | ICD-10-CM | POA: Diagnosis not present

## 2017-10-07 DIAGNOSIS — M5430 Sciatica, unspecified side: Secondary | ICD-10-CM | POA: Diagnosis not present

## 2017-12-21 ENCOUNTER — Other Ambulatory Visit: Payer: Self-pay | Admitting: Physician Assistant

## 2017-12-21 DIAGNOSIS — Z1231 Encounter for screening mammogram for malignant neoplasm of breast: Secondary | ICD-10-CM

## 2017-12-29 ENCOUNTER — Ambulatory Visit: Payer: BLUE CROSS/BLUE SHIELD

## 2018-01-02 ENCOUNTER — Ambulatory Visit
Admission: RE | Admit: 2018-01-02 | Discharge: 2018-01-02 | Disposition: A | Discharge status: Home / Self Care | Payer: BLUE CROSS/BLUE SHIELD | Source: Ambulatory Visit | Attending: Physician Assistant | Admitting: Physician Assistant

## 2018-01-02 DIAGNOSIS — Z1231 Encounter for screening mammogram for malignant neoplasm of breast: Secondary | ICD-10-CM | POA: Insufficient documentation

## 2018-02-20 DIAGNOSIS — E7800 Pure hypercholesterolemia, unspecified: Secondary | ICD-10-CM | POA: Diagnosis not present

## 2018-02-20 DIAGNOSIS — E1169 Type 2 diabetes mellitus with other specified complication: Secondary | ICD-10-CM | POA: Diagnosis not present

## 2018-02-20 DIAGNOSIS — K219 Gastro-esophageal reflux disease without esophagitis: Secondary | ICD-10-CM | POA: Diagnosis not present

## 2018-02-20 DIAGNOSIS — I10 Essential (primary) hypertension: Secondary | ICD-10-CM | POA: Diagnosis not present

## 2018-08-01 DIAGNOSIS — E119 Type 2 diabetes mellitus without complications: Secondary | ICD-10-CM | POA: Diagnosis not present

## 2018-08-07 DIAGNOSIS — Z0000 Encounter for general adult medical examination without abnormal findings: Secondary | ICD-10-CM | POA: Diagnosis not present

## 2018-08-07 DIAGNOSIS — E1169 Type 2 diabetes mellitus with other specified complication: Secondary | ICD-10-CM | POA: Diagnosis not present

## 2018-08-07 DIAGNOSIS — E7800 Pure hypercholesterolemia, unspecified: Secondary | ICD-10-CM | POA: Diagnosis not present

## 2018-08-07 DIAGNOSIS — I10 Essential (primary) hypertension: Secondary | ICD-10-CM | POA: Diagnosis not present

## 2018-08-07 DIAGNOSIS — K219 Gastro-esophageal reflux disease without esophagitis: Secondary | ICD-10-CM | POA: Diagnosis not present

## 2018-08-15 DIAGNOSIS — I10 Essential (primary) hypertension: Secondary | ICD-10-CM | POA: Diagnosis not present

## 2018-08-15 DIAGNOSIS — Z23 Encounter for immunization: Secondary | ICD-10-CM | POA: Diagnosis not present

## 2018-08-15 DIAGNOSIS — E1169 Type 2 diabetes mellitus with other specified complication: Secondary | ICD-10-CM | POA: Diagnosis not present

## 2018-12-18 ENCOUNTER — Other Ambulatory Visit: Payer: Self-pay | Admitting: Physician Assistant

## 2018-12-18 DIAGNOSIS — Z1231 Encounter for screening mammogram for malignant neoplasm of breast: Secondary | ICD-10-CM

## 2019-01-09 ENCOUNTER — Ambulatory Visit
Admission: RE | Admit: 2019-01-09 | Discharge: 2019-01-09 | Disposition: A | Discharge status: Home / Self Care | Payer: BC Managed Care – PPO | Source: Ambulatory Visit | Attending: Physician Assistant | Admitting: Physician Assistant

## 2019-01-09 DIAGNOSIS — Z1231 Encounter for screening mammogram for malignant neoplasm of breast: Secondary | ICD-10-CM

## 2019-02-06 DIAGNOSIS — E7800 Pure hypercholesterolemia, unspecified: Secondary | ICD-10-CM | POA: Diagnosis not present

## 2019-02-06 DIAGNOSIS — K219 Gastro-esophageal reflux disease without esophagitis: Secondary | ICD-10-CM | POA: Diagnosis not present

## 2019-02-06 DIAGNOSIS — E1169 Type 2 diabetes mellitus with other specified complication: Secondary | ICD-10-CM | POA: Diagnosis not present

## 2019-02-06 DIAGNOSIS — Z23 Encounter for immunization: Secondary | ICD-10-CM | POA: Diagnosis not present

## 2019-02-06 DIAGNOSIS — I10 Essential (primary) hypertension: Secondary | ICD-10-CM | POA: Diagnosis not present

## 2019-02-13 ENCOUNTER — Other Ambulatory Visit: Payer: Self-pay | Admitting: Physician Assistant

## 2019-02-13 DIAGNOSIS — L308 Other specified dermatitis: Secondary | ICD-10-CM | POA: Diagnosis not present

## 2019-03-12 DIAGNOSIS — L404 Guttate psoriasis: Secondary | ICD-10-CM | POA: Diagnosis not present

## 2019-04-24 DIAGNOSIS — L400 Psoriasis vulgaris: Secondary | ICD-10-CM | POA: Diagnosis not present

## 2019-05-01 DIAGNOSIS — Z79899 Other long term (current) drug therapy: Secondary | ICD-10-CM | POA: Diagnosis not present

## 2019-05-01 DIAGNOSIS — L400 Psoriasis vulgaris: Secondary | ICD-10-CM | POA: Diagnosis not present

## 2019-05-09 DIAGNOSIS — E1169 Type 2 diabetes mellitus with other specified complication: Secondary | ICD-10-CM | POA: Diagnosis not present

## 2019-05-28 DIAGNOSIS — Z79899 Other long term (current) drug therapy: Secondary | ICD-10-CM | POA: Diagnosis not present

## 2019-05-28 DIAGNOSIS — L400 Psoriasis vulgaris: Secondary | ICD-10-CM | POA: Diagnosis not present

## 2019-06-28 DIAGNOSIS — L400 Psoriasis vulgaris: Secondary | ICD-10-CM | POA: Diagnosis not present

## 2019-06-28 DIAGNOSIS — Z79899 Other long term (current) drug therapy: Secondary | ICD-10-CM | POA: Diagnosis not present

## 2019-08-14 ENCOUNTER — Other Ambulatory Visit (HOSPITAL_COMMUNITY)
Admission: RE | Admit: 2019-08-14 | Discharge: 2019-08-14 | Disposition: A | Discharge status: Home / Self Care | Payer: BC Managed Care – PPO | Source: Ambulatory Visit | Attending: Physician Assistant | Admitting: Physician Assistant

## 2019-08-14 ENCOUNTER — Other Ambulatory Visit: Payer: Self-pay | Admitting: Physician Assistant

## 2019-08-14 DIAGNOSIS — Z124 Encounter for screening for malignant neoplasm of cervix: Secondary | ICD-10-CM | POA: Insufficient documentation

## 2019-08-14 DIAGNOSIS — I10 Essential (primary) hypertension: Secondary | ICD-10-CM | POA: Diagnosis not present

## 2019-08-14 DIAGNOSIS — E1169 Type 2 diabetes mellitus with other specified complication: Secondary | ICD-10-CM | POA: Diagnosis not present

## 2019-08-14 DIAGNOSIS — E7800 Pure hypercholesterolemia, unspecified: Secondary | ICD-10-CM | POA: Diagnosis not present

## 2019-08-14 DIAGNOSIS — Z0000 Encounter for general adult medical examination without abnormal findings: Secondary | ICD-10-CM | POA: Diagnosis not present

## 2019-08-15 LAB — CYTOLOGY - PAP: Diagnosis: NEGATIVE

## 2019-08-24 DIAGNOSIS — L400 Psoriasis vulgaris: Secondary | ICD-10-CM | POA: Diagnosis not present

## 2019-08-24 DIAGNOSIS — Z23 Encounter for immunization: Secondary | ICD-10-CM | POA: Diagnosis not present

## 2019-08-24 DIAGNOSIS — Z79899 Other long term (current) drug therapy: Secondary | ICD-10-CM | POA: Diagnosis not present

## 2019-11-12 DIAGNOSIS — L400 Psoriasis vulgaris: Secondary | ICD-10-CM | POA: Diagnosis not present

## 2019-11-12 DIAGNOSIS — Z79899 Other long term (current) drug therapy: Secondary | ICD-10-CM | POA: Diagnosis not present

## 2019-11-19 DIAGNOSIS — E7800 Pure hypercholesterolemia, unspecified: Secondary | ICD-10-CM | POA: Diagnosis not present

## 2019-11-23 ENCOUNTER — Other Ambulatory Visit: Payer: Self-pay | Admitting: Physician Assistant

## 2019-11-23 DIAGNOSIS — R7401 Elevation of levels of liver transaminase levels: Secondary | ICD-10-CM

## 2019-11-29 ENCOUNTER — Other Ambulatory Visit: Payer: Self-pay | Admitting: Physician Assistant

## 2019-11-29 DIAGNOSIS — R7401 Elevation of levels of liver transaminase levels: Secondary | ICD-10-CM

## 2019-12-03 ENCOUNTER — Ambulatory Visit
Admission: RE | Admit: 2019-12-03 | Discharge: 2019-12-03 | Disposition: A | Discharge status: Home / Self Care | Payer: BC Managed Care – PPO | Source: Ambulatory Visit | Attending: Physician Assistant | Admitting: Physician Assistant

## 2019-12-03 DIAGNOSIS — R7401 Elevation of levels of liver transaminase levels: Secondary | ICD-10-CM

## 2019-12-03 DIAGNOSIS — Z9049 Acquired absence of other specified parts of digestive tract: Secondary | ICD-10-CM | POA: Diagnosis not present

## 2019-12-03 DIAGNOSIS — R7989 Other specified abnormal findings of blood chemistry: Secondary | ICD-10-CM | POA: Diagnosis not present

## 2019-12-03 DIAGNOSIS — K7689 Other specified diseases of liver: Secondary | ICD-10-CM | POA: Diagnosis not present

## 2019-12-03 DIAGNOSIS — K760 Fatty (change of) liver, not elsewhere classified: Secondary | ICD-10-CM | POA: Diagnosis not present

## 2020-01-10 ENCOUNTER — Other Ambulatory Visit: Payer: Self-pay | Admitting: Physician Assistant

## 2020-01-10 DIAGNOSIS — Z1231 Encounter for screening mammogram for malignant neoplasm of breast: Secondary | ICD-10-CM

## 2020-01-18 ENCOUNTER — Other Ambulatory Visit: Payer: Self-pay

## 2020-01-18 ENCOUNTER — Ambulatory Visit
Admission: RE | Admit: 2020-01-18 | Discharge: 2020-01-18 | Disposition: A | Discharge status: Home / Self Care | Payer: BC Managed Care – PPO | Source: Ambulatory Visit | Attending: Physician Assistant | Admitting: Physician Assistant

## 2020-01-18 DIAGNOSIS — Z1231 Encounter for screening mammogram for malignant neoplasm of breast: Secondary | ICD-10-CM | POA: Insufficient documentation

## 2020-01-22 ENCOUNTER — Other Ambulatory Visit: Payer: Self-pay | Admitting: Physician Assistant

## 2020-01-24 ENCOUNTER — Other Ambulatory Visit: Payer: Self-pay | Admitting: Physician Assistant

## 2020-01-24 DIAGNOSIS — R928 Other abnormal and inconclusive findings on diagnostic imaging of breast: Secondary | ICD-10-CM

## 2020-01-24 DIAGNOSIS — N6489 Other specified disorders of breast: Secondary | ICD-10-CM

## 2020-01-29 ENCOUNTER — Other Ambulatory Visit: Payer: Self-pay

## 2020-01-29 ENCOUNTER — Ambulatory Visit
Admission: RE | Admit: 2020-01-29 | Discharge: 2020-01-29 | Disposition: A | Discharge status: Home / Self Care | Payer: BC Managed Care – PPO | Source: Ambulatory Visit | Attending: Physician Assistant | Admitting: Physician Assistant

## 2020-01-29 DIAGNOSIS — N6489 Other specified disorders of breast: Secondary | ICD-10-CM

## 2020-01-29 DIAGNOSIS — R928 Other abnormal and inconclusive findings on diagnostic imaging of breast: Secondary | ICD-10-CM | POA: Diagnosis not present

## 2020-02-14 DIAGNOSIS — D485 Neoplasm of uncertain behavior of skin: Secondary | ICD-10-CM | POA: Diagnosis not present

## 2020-02-14 DIAGNOSIS — L400 Psoriasis vulgaris: Secondary | ICD-10-CM | POA: Diagnosis not present

## 2020-02-14 DIAGNOSIS — Z79899 Other long term (current) drug therapy: Secondary | ICD-10-CM | POA: Diagnosis not present

## 2020-02-14 DIAGNOSIS — L920 Granuloma annulare: Secondary | ICD-10-CM | POA: Diagnosis not present

## 2020-02-18 DIAGNOSIS — E7800 Pure hypercholesterolemia, unspecified: Secondary | ICD-10-CM | POA: Diagnosis not present

## 2020-02-18 DIAGNOSIS — I10 Essential (primary) hypertension: Secondary | ICD-10-CM | POA: Diagnosis not present

## 2020-02-18 DIAGNOSIS — Z23 Encounter for immunization: Secondary | ICD-10-CM | POA: Diagnosis not present

## 2020-02-18 DIAGNOSIS — E1169 Type 2 diabetes mellitus with other specified complication: Secondary | ICD-10-CM | POA: Diagnosis not present

## 2020-02-18 DIAGNOSIS — K219 Gastro-esophageal reflux disease without esophagitis: Secondary | ICD-10-CM | POA: Diagnosis not present

## 2020-06-20 DIAGNOSIS — L400 Psoriasis vulgaris: Secondary | ICD-10-CM | POA: Diagnosis not present

## 2020-06-20 DIAGNOSIS — Z79899 Other long term (current) drug therapy: Secondary | ICD-10-CM | POA: Diagnosis not present

## 2020-07-30 DIAGNOSIS — L409 Psoriasis, unspecified: Secondary | ICD-10-CM | POA: Diagnosis not present

## 2020-07-30 DIAGNOSIS — R5383 Other fatigue: Secondary | ICD-10-CM | POA: Diagnosis not present

## 2020-07-30 DIAGNOSIS — R110 Nausea: Secondary | ICD-10-CM | POA: Diagnosis not present

## 2020-07-30 DIAGNOSIS — M79642 Pain in left hand: Secondary | ICD-10-CM | POA: Diagnosis not present

## 2020-07-30 DIAGNOSIS — M150 Primary generalized (osteo)arthritis: Secondary | ICD-10-CM | POA: Diagnosis not present

## 2020-08-14 DIAGNOSIS — E7800 Pure hypercholesterolemia, unspecified: Secondary | ICD-10-CM | POA: Diagnosis not present

## 2020-08-14 DIAGNOSIS — E1169 Type 2 diabetes mellitus with other specified complication: Secondary | ICD-10-CM | POA: Diagnosis not present

## 2020-08-14 DIAGNOSIS — K219 Gastro-esophageal reflux disease without esophagitis: Secondary | ICD-10-CM | POA: Diagnosis not present

## 2020-08-14 DIAGNOSIS — I10 Essential (primary) hypertension: Secondary | ICD-10-CM | POA: Diagnosis not present

## 2020-08-14 DIAGNOSIS — Z0000 Encounter for general adult medical examination without abnormal findings: Secondary | ICD-10-CM | POA: Diagnosis not present

## 2020-08-27 DIAGNOSIS — M79642 Pain in left hand: Secondary | ICD-10-CM | POA: Diagnosis not present

## 2020-08-27 DIAGNOSIS — M150 Primary generalized (osteo)arthritis: Secondary | ICD-10-CM | POA: Diagnosis not present

## 2020-08-27 DIAGNOSIS — L409 Psoriasis, unspecified: Secondary | ICD-10-CM | POA: Diagnosis not present

## 2020-08-27 DIAGNOSIS — M25571 Pain in right ankle and joints of right foot: Secondary | ICD-10-CM | POA: Diagnosis not present

## 2020-09-08 DIAGNOSIS — H5203 Hypermetropia, bilateral: Secondary | ICD-10-CM | POA: Diagnosis not present

## 2020-09-08 DIAGNOSIS — I10 Essential (primary) hypertension: Secondary | ICD-10-CM | POA: Diagnosis not present

## 2020-09-08 DIAGNOSIS — H2513 Age-related nuclear cataract, bilateral: Secondary | ICD-10-CM | POA: Diagnosis not present

## 2020-09-08 DIAGNOSIS — H35033 Hypertensive retinopathy, bilateral: Secondary | ICD-10-CM | POA: Diagnosis not present

## 2020-11-20 DIAGNOSIS — Z79899 Other long term (current) drug therapy: Secondary | ICD-10-CM | POA: Diagnosis not present

## 2020-11-20 DIAGNOSIS — L400 Psoriasis vulgaris: Secondary | ICD-10-CM | POA: Diagnosis not present

## 2020-11-20 DIAGNOSIS — L920 Granuloma annulare: Secondary | ICD-10-CM | POA: Diagnosis not present

## 2021-01-09 ENCOUNTER — Other Ambulatory Visit: Payer: Self-pay | Admitting: Physician Assistant

## 2021-01-09 DIAGNOSIS — Z1231 Encounter for screening mammogram for malignant neoplasm of breast: Secondary | ICD-10-CM

## 2021-01-29 ENCOUNTER — Other Ambulatory Visit: Payer: Self-pay

## 2021-01-29 ENCOUNTER — Ambulatory Visit
Admission: RE | Admit: 2021-01-29 | Discharge: 2021-01-29 | Disposition: A | Discharge status: Home / Self Care | Payer: BC Managed Care – PPO | Source: Ambulatory Visit | Attending: Physician Assistant | Admitting: Physician Assistant

## 2021-01-29 DIAGNOSIS — Z1231 Encounter for screening mammogram for malignant neoplasm of breast: Secondary | ICD-10-CM | POA: Diagnosis not present

## 2021-03-18 DIAGNOSIS — K760 Fatty (change of) liver, not elsewhere classified: Secondary | ICD-10-CM | POA: Diagnosis not present

## 2021-03-18 DIAGNOSIS — I10 Essential (primary) hypertension: Secondary | ICD-10-CM | POA: Diagnosis not present

## 2021-03-18 DIAGNOSIS — E7800 Pure hypercholesterolemia, unspecified: Secondary | ICD-10-CM | POA: Diagnosis not present

## 2021-03-18 DIAGNOSIS — E1169 Type 2 diabetes mellitus with other specified complication: Secondary | ICD-10-CM | POA: Diagnosis not present

## 2021-04-02 DIAGNOSIS — H6592 Unspecified nonsuppurative otitis media, left ear: Secondary | ICD-10-CM | POA: Diagnosis not present

## 2021-04-02 DIAGNOSIS — H6123 Impacted cerumen, bilateral: Secondary | ICD-10-CM | POA: Diagnosis not present

## 2021-06-18 DIAGNOSIS — Z79899 Other long term (current) drug therapy: Secondary | ICD-10-CM | POA: Diagnosis not present

## 2021-06-18 DIAGNOSIS — L400 Psoriasis vulgaris: Secondary | ICD-10-CM | POA: Diagnosis not present

## 2021-07-18 DIAGNOSIS — S0502XA Injury of conjunctiva and corneal abrasion without foreign body, left eye, initial encounter: Secondary | ICD-10-CM | POA: Diagnosis not present

## 2021-08-26 DIAGNOSIS — E7800 Pure hypercholesterolemia, unspecified: Secondary | ICD-10-CM | POA: Diagnosis not present

## 2021-08-26 DIAGNOSIS — K760 Fatty (change of) liver, not elsewhere classified: Secondary | ICD-10-CM | POA: Diagnosis not present

## 2021-08-26 DIAGNOSIS — E1169 Type 2 diabetes mellitus with other specified complication: Secondary | ICD-10-CM | POA: Diagnosis not present

## 2021-08-26 DIAGNOSIS — H35033 Hypertensive retinopathy, bilateral: Secondary | ICD-10-CM | POA: Diagnosis not present

## 2021-08-26 DIAGNOSIS — Z0000 Encounter for general adult medical examination without abnormal findings: Secondary | ICD-10-CM | POA: Diagnosis not present

## 2021-12-11 DIAGNOSIS — K648 Other hemorrhoids: Secondary | ICD-10-CM | POA: Diagnosis not present

## 2021-12-11 DIAGNOSIS — Z1211 Encounter for screening for malignant neoplasm of colon: Secondary | ICD-10-CM | POA: Diagnosis not present

## 2021-12-11 DIAGNOSIS — D122 Benign neoplasm of ascending colon: Secondary | ICD-10-CM | POA: Diagnosis not present

## 2021-12-22 DIAGNOSIS — L920 Granuloma annulare: Secondary | ICD-10-CM | POA: Diagnosis not present

## 2021-12-22 DIAGNOSIS — L400 Psoriasis vulgaris: Secondary | ICD-10-CM | POA: Diagnosis not present

## 2021-12-22 DIAGNOSIS — Z79899 Other long term (current) drug therapy: Secondary | ICD-10-CM | POA: Diagnosis not present

## 2022-01-22 ENCOUNTER — Other Ambulatory Visit: Payer: Self-pay | Admitting: Physician Assistant

## 2022-01-22 DIAGNOSIS — Z1231 Encounter for screening mammogram for malignant neoplasm of breast: Secondary | ICD-10-CM

## 2022-01-27 DIAGNOSIS — H5203 Hypermetropia, bilateral: Secondary | ICD-10-CM | POA: Diagnosis not present

## 2022-01-27 DIAGNOSIS — H2513 Age-related nuclear cataract, bilateral: Secondary | ICD-10-CM | POA: Diagnosis not present

## 2022-01-27 DIAGNOSIS — E119 Type 2 diabetes mellitus without complications: Secondary | ICD-10-CM | POA: Diagnosis not present

## 2022-01-27 DIAGNOSIS — Z7984 Long term (current) use of oral hypoglycemic drugs: Secondary | ICD-10-CM | POA: Diagnosis not present

## 2022-02-02 ENCOUNTER — Ambulatory Visit
Admission: RE | Admit: 2022-02-02 | Discharge: 2022-02-02 | Disposition: A | Discharge status: Home / Self Care | Payer: BC Managed Care – PPO | Source: Ambulatory Visit | Attending: Physician Assistant | Admitting: Physician Assistant

## 2022-02-02 DIAGNOSIS — Z1231 Encounter for screening mammogram for malignant neoplasm of breast: Secondary | ICD-10-CM | POA: Diagnosis not present

## 2022-03-22 DIAGNOSIS — E1169 Type 2 diabetes mellitus with other specified complication: Secondary | ICD-10-CM | POA: Diagnosis not present

## 2022-03-22 DIAGNOSIS — L409 Psoriasis, unspecified: Secondary | ICD-10-CM | POA: Diagnosis not present

## 2022-03-22 DIAGNOSIS — H35033 Hypertensive retinopathy, bilateral: Secondary | ICD-10-CM | POA: Diagnosis not present

## 2022-03-22 DIAGNOSIS — K760 Fatty (change of) liver, not elsewhere classified: Secondary | ICD-10-CM | POA: Diagnosis not present

## 2022-03-22 DIAGNOSIS — I10 Essential (primary) hypertension: Secondary | ICD-10-CM | POA: Diagnosis not present

## 2022-03-22 DIAGNOSIS — E7800 Pure hypercholesterolemia, unspecified: Secondary | ICD-10-CM | POA: Diagnosis not present

## 2022-03-22 DIAGNOSIS — Z5181 Encounter for therapeutic drug level monitoring: Secondary | ICD-10-CM | POA: Diagnosis not present

## 2022-03-25 DIAGNOSIS — Z79899 Other long term (current) drug therapy: Secondary | ICD-10-CM | POA: Diagnosis not present

## 2022-03-25 DIAGNOSIS — L400 Psoriasis vulgaris: Secondary | ICD-10-CM | POA: Diagnosis not present

## 2022-05-13 DIAGNOSIS — N644 Mastodynia: Secondary | ICD-10-CM | POA: Diagnosis not present

## 2022-05-14 ENCOUNTER — Other Ambulatory Visit: Payer: Self-pay | Admitting: Physician Assistant

## 2022-05-14 DIAGNOSIS — N644 Mastodynia: Secondary | ICD-10-CM

## 2022-05-26 ENCOUNTER — Ambulatory Visit
Admission: RE | Admit: 2022-05-26 | Discharge: 2022-05-26 | Disposition: A | Discharge status: Home / Self Care | Payer: BC Managed Care – PPO | Source: Ambulatory Visit | Attending: Physician Assistant | Admitting: Physician Assistant

## 2022-05-26 DIAGNOSIS — N644 Mastodynia: Secondary | ICD-10-CM | POA: Diagnosis not present

## 2022-06-23 DIAGNOSIS — L400 Psoriasis vulgaris: Secondary | ICD-10-CM | POA: Diagnosis not present

## 2022-06-23 DIAGNOSIS — L920 Granuloma annulare: Secondary | ICD-10-CM | POA: Diagnosis not present

## 2022-06-23 DIAGNOSIS — Z79899 Other long term (current) drug therapy: Secondary | ICD-10-CM | POA: Diagnosis not present

## 2022-07-27 IMAGING — MG DIGITAL SCREENING BILAT W/ TOMO W/ CAD
8 series · 9 of 24 positions shown · non-contrast
Comparison: Previous exam(s).

CLINICAL DATA: Screening.

EXAM:
DIGITAL SCREENING BILATERAL MAMMOGRAM WITH TOMO AND CAD

[R MLO synth-2D]
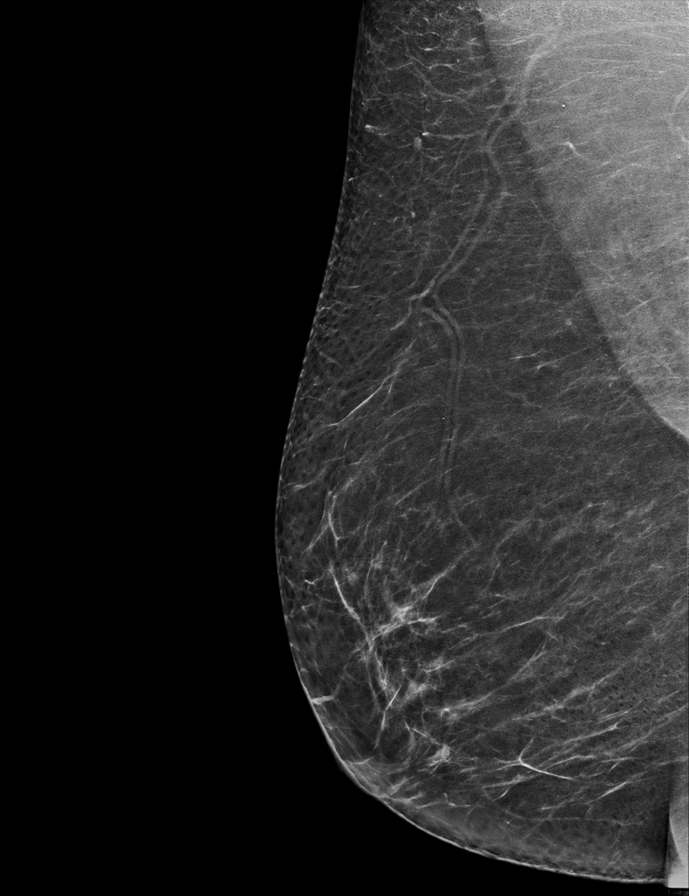

[R CC synth-2D]
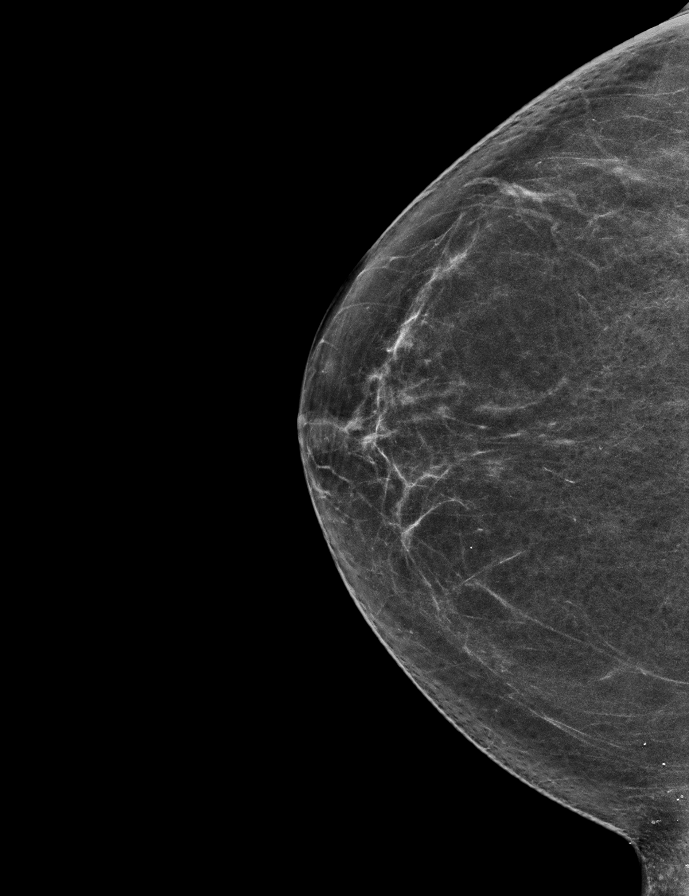

[L CC synth-2D]
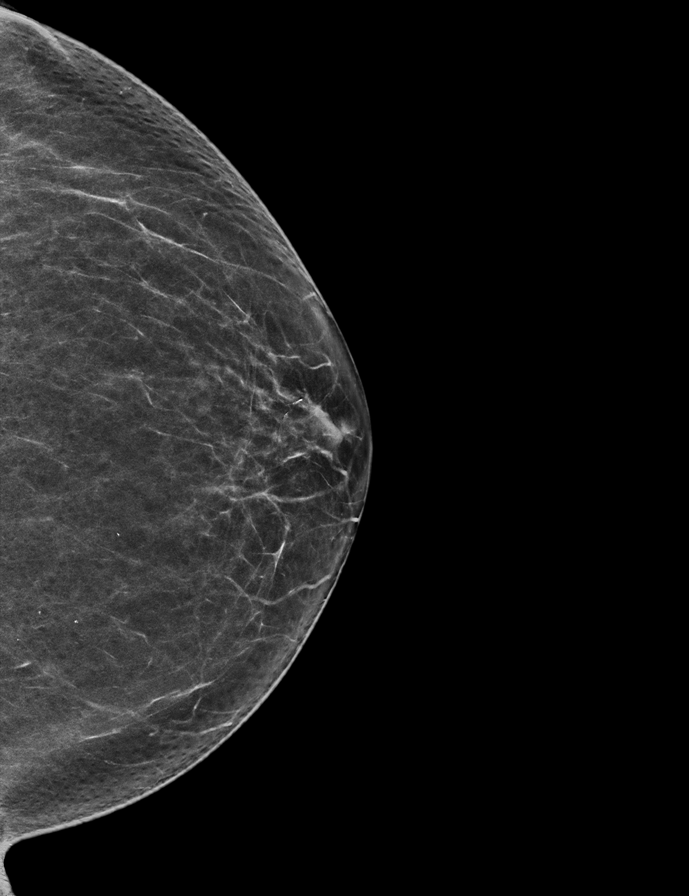

[L MLO synth-2D]
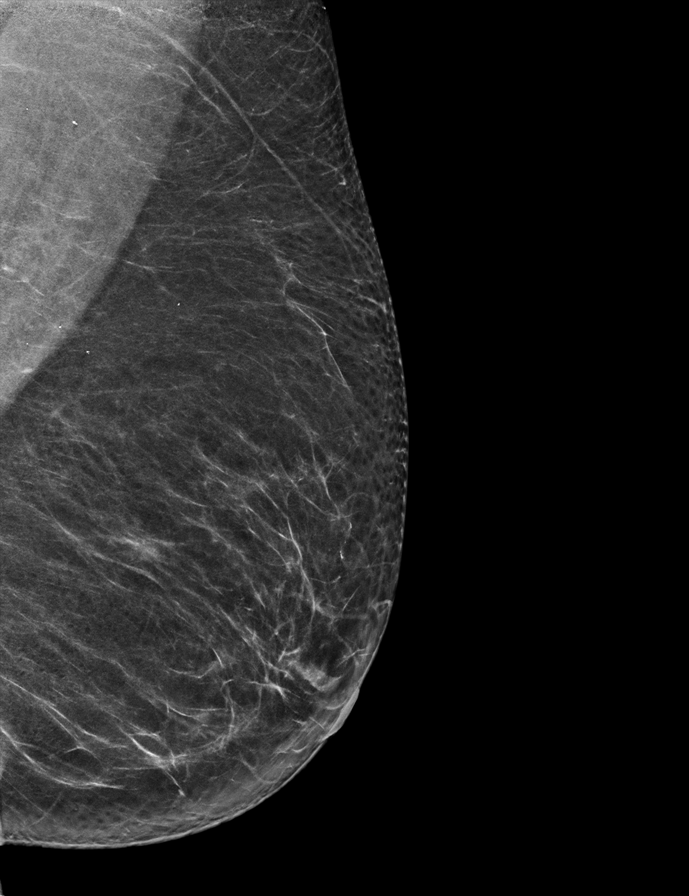

[R MLO tomo · 2 of 64 frames shown]
[frame 21/64]
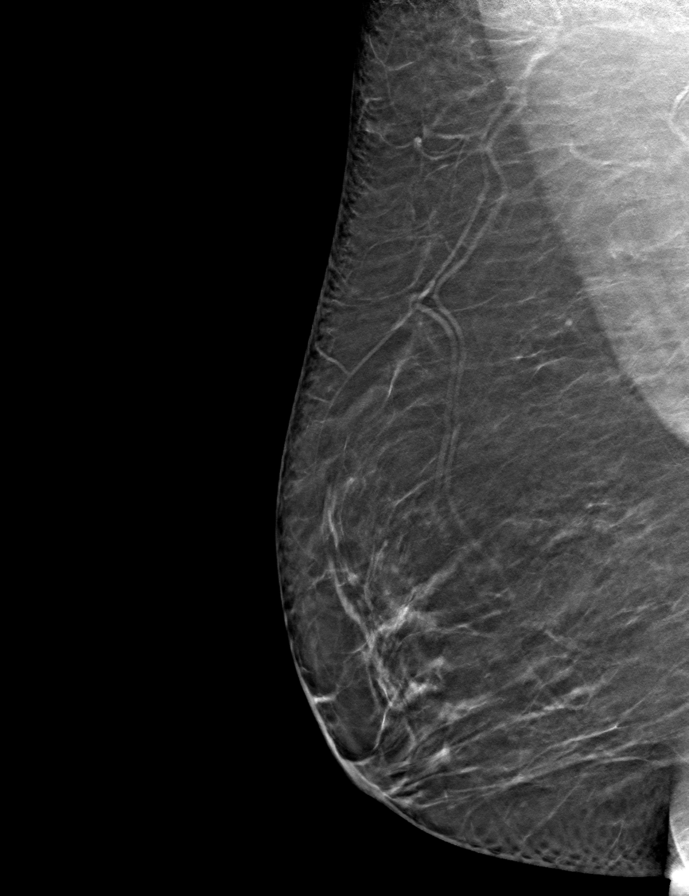
[frame 33/64]
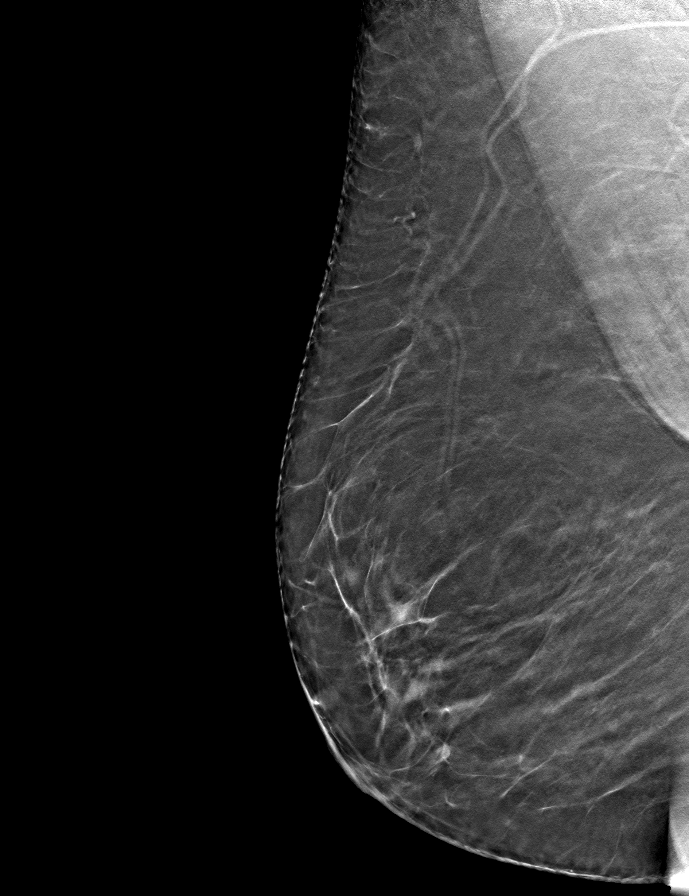

[L CC tomo · tomo slice 31/62.0]
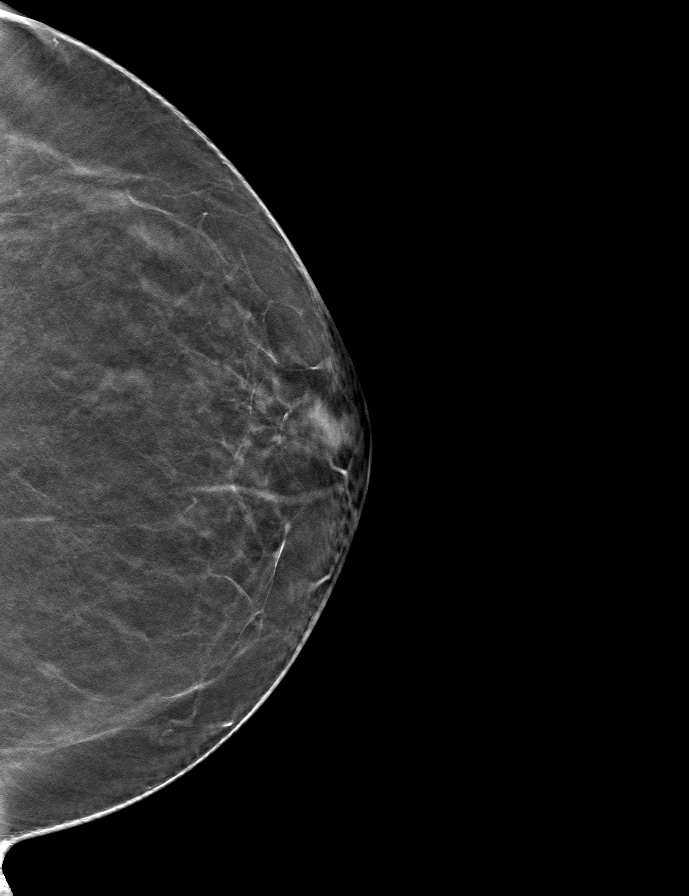

[R CC tomo · tomo slice 31/62.0]
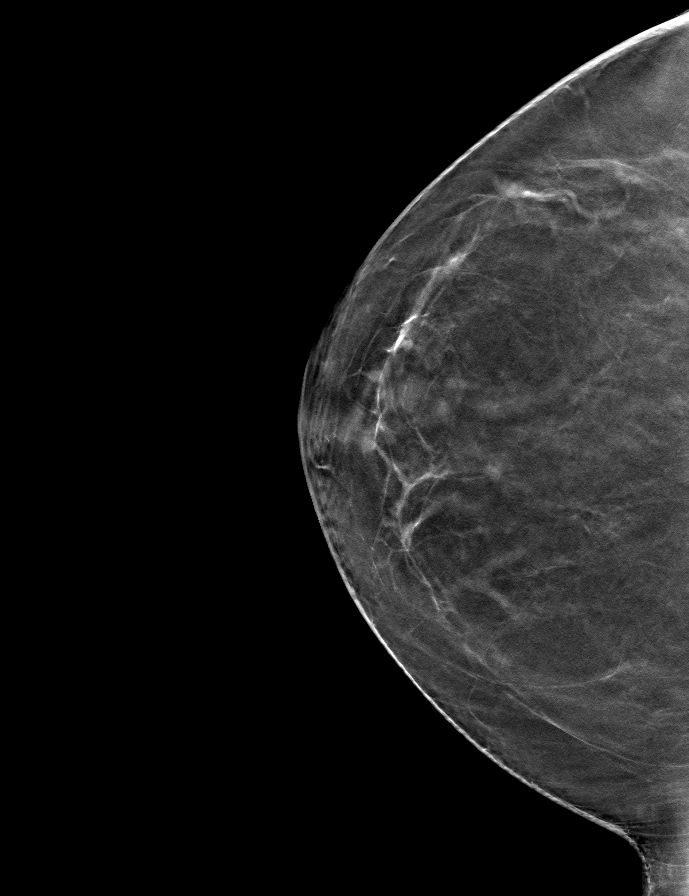

[L MLO tomo · tomo slice 33/65.0]
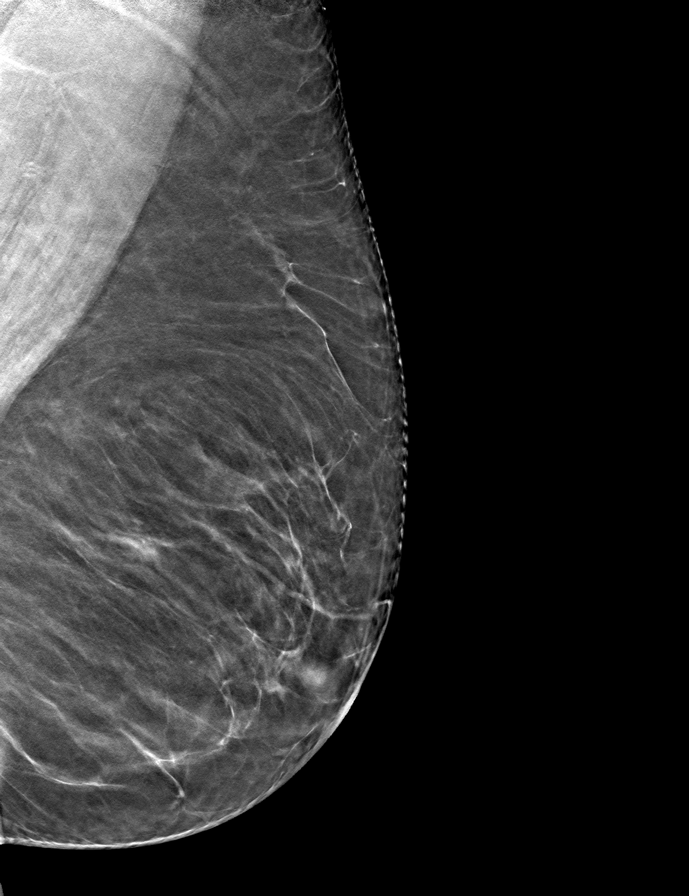

[9 of 24 positions shown; findings below may reference images not displayed]

ACR Breast Density Category b: There are scattered areas of
fibroglandular density.
FINDINGS: In the left breast, a possible asymmetry warrants further
evaluation. In the right breast, no findings suspicious for
malignancy. Images were processed with CAD.
IMPRESSION: Further evaluation is suggested for possible asymmetry in the left
breast.

RECOMMENDATION:
Diagnostic mammogram and possibly ultrasound of the left breast.
(Code:DI-5-LL4)

The patient will be contacted regarding the findings, and additional
imaging will be scheduled.

BI-RADS CATEGORY  0: Incomplete. Need additional imaging evaluation
and/or prior mammograms for comparison.

## 2023-03-24 ENCOUNTER — Other Ambulatory Visit: Payer: Self-pay | Admitting: Family Medicine

## 2023-03-24 DIAGNOSIS — Z1231 Encounter for screening mammogram for malignant neoplasm of breast: Secondary | ICD-10-CM

## 2023-10-19 ENCOUNTER — Ambulatory Visit
Admission: RE | Admit: 2023-10-19 | Discharge: 2023-10-19 | Disposition: A | Discharge status: Home / Self Care | Source: Ambulatory Visit | Attending: Family Medicine

## 2023-10-19 DIAGNOSIS — Z1231 Encounter for screening mammogram for malignant neoplasm of breast: Secondary | ICD-10-CM
